# Patient Record
Sex: Female | Born: 1985 | Hispanic: Yes | Marital: Single | Smoking: Never smoker
Health system: Southern US, Community
[De-identification: ages and names within clinical notes are randomized; demographics above are authoritative.]

## PROBLEM LIST (undated history)

## (undated) ENCOUNTER — Inpatient Hospital Stay: Payer: Self-pay

## (undated) DIAGNOSIS — Z789 Other specified health status: Secondary | ICD-10-CM

## (undated) DIAGNOSIS — N760 Acute vaginitis: Secondary | ICD-10-CM

## (undated) DIAGNOSIS — B9689 Other specified bacterial agents as the cause of diseases classified elsewhere: Secondary | ICD-10-CM

## (undated) HISTORY — DX: Acute vaginitis: B96.89

## (undated) HISTORY — DX: Other specified bacterial agents as the cause of diseases classified elsewhere: N76.0

---

## 2005-03-03 ENCOUNTER — Emergency Department: Payer: Self-pay | Admitting: Emergency Medicine

## 2008-06-19 ENCOUNTER — Observation Stay: Payer: Self-pay

## 2008-06-25 ENCOUNTER — Inpatient Hospital Stay: Payer: Self-pay

## 2012-02-18 ENCOUNTER — Ambulatory Visit: Payer: Self-pay | Admitting: Sports Medicine

## 2013-06-25 ENCOUNTER — Emergency Department: Payer: Self-pay

## 2017-05-21 ENCOUNTER — Other Ambulatory Visit: Payer: Self-pay | Admitting: Family Medicine

## 2017-05-21 DIAGNOSIS — Z3492 Encounter for supervision of normal pregnancy, unspecified, second trimester: Secondary | ICD-10-CM

## 2017-06-11 ENCOUNTER — Ambulatory Visit
Admission: RE | Admit: 2017-06-11 | Discharge: 2017-06-11 | Disposition: A | Discharge status: Home / Self Care | Payer: Medicaid Other | Source: Ambulatory Visit | Attending: Family Medicine | Admitting: Family Medicine

## 2017-06-11 DIAGNOSIS — Z3482 Encounter for supervision of other normal pregnancy, second trimester: Secondary | ICD-10-CM | POA: Diagnosis not present

## 2017-06-11 DIAGNOSIS — Z3492 Encounter for supervision of normal pregnancy, unspecified, second trimester: Secondary | ICD-10-CM

## 2017-06-11 DIAGNOSIS — Z3A19 19 weeks gestation of pregnancy: Secondary | ICD-10-CM | POA: Insufficient documentation

## 2017-06-11 DIAGNOSIS — Z3480 Encounter for supervision of other normal pregnancy, unspecified trimester: Secondary | ICD-10-CM | POA: Diagnosis present

## 2017-07-09 ENCOUNTER — Inpatient Hospital Stay: Payer: Medicaid Other

## 2017-07-09 ENCOUNTER — Inpatient Hospital Stay
Admission: EM | Admit: 2017-07-09 | Discharge: 2017-07-09 | Disposition: A | Discharge status: Home / Self Care | Payer: Medicaid Other | Attending: Obstetrics and Gynecology | Admitting: Obstetrics and Gynecology

## 2017-07-09 DIAGNOSIS — Z79899 Other long term (current) drug therapy: Secondary | ICD-10-CM | POA: Diagnosis not present

## 2017-07-09 DIAGNOSIS — Z3A23 23 weeks gestation of pregnancy: Secondary | ICD-10-CM | POA: Diagnosis not present

## 2017-07-09 DIAGNOSIS — O4702 False labor before 37 completed weeks of gestation, second trimester: Secondary | ICD-10-CM

## 2017-07-09 HISTORY — DX: Other specified health status: Z78.9

## 2017-07-09 LAB — URINALYSIS, ROUTINE W REFLEX MICROSCOPIC
BILIRUBIN URINE: NEGATIVE
Glucose, UA: 50 mg/dL — AB
Hgb urine dipstick: NEGATIVE
KETONES UR: 5 mg/dL — AB
LEUKOCYTES UA: NEGATIVE
NITRITE: NEGATIVE
PH: 5 (ref 5.0–8.0)
Protein, ur: NEGATIVE mg/dL
Specific Gravity, Urine: 1.02 (ref 1.005–1.030)

## 2017-07-09 LAB — URINE DRUG SCREEN, QUALITATIVE (ARMC ONLY)
AMPHETAMINES, UR SCREEN: NOT DETECTED
BARBITURATES, UR SCREEN: NOT DETECTED
BENZODIAZEPINE, UR SCRN: NOT DETECTED
Cannabinoid 50 Ng, Ur ~~LOC~~: NOT DETECTED
Cocaine Metabolite,Ur ~~LOC~~: NOT DETECTED
MDMA (Ecstasy)Ur Screen: NOT DETECTED
METHADONE SCREEN, URINE: NOT DETECTED
OPIATE, UR SCREEN: NOT DETECTED
Phencyclidine (PCP) Ur S: NOT DETECTED
Tricyclic, Ur Screen: NOT DETECTED

## 2017-07-09 LAB — WET PREP, GENITAL
Sperm: NONE SEEN
TRICH WET PREP: NONE SEEN
YEAST WET PREP: NONE SEEN

## 2017-07-09 LAB — CHLAMYDIA/NGC RT PCR (ARMC ONLY)
CHLAMYDIA TR: NOT DETECTED
N gonorrhoeae: NOT DETECTED

## 2017-07-09 MED ORDER — LACTATED RINGERS IV SOLN: 500.0000 mL/h | Freq: Once | INTRAVENOUS | Status: DC

## 2017-07-09 MED ORDER — METRONIDAZOLE 0.75 % VA GEL: 1.0000 | Freq: Every day | VAGINAL | 0 refills | Status: DC

## 2017-07-09 MED ORDER — CALCIUM CARBONATE ANTACID 500 MG PO CHEW: 2.0000 | CHEWABLE_TABLET | ORAL | Status: DC | PRN

## 2017-07-09 MED ORDER — ACETAMINOPHEN 325 MG PO TABS: 650.0000 mg | ORAL_TABLET | ORAL | Status: DC | PRN

## 2017-07-09 MED ORDER — ZOLPIDEM TARTRATE 5 MG PO TABS: 5.0000 mg | ORAL_TABLET | Freq: Every evening | ORAL | Status: DC | PRN

## 2017-07-09 MED ORDER — METRONIDAZOLE 0.75 % VA GEL
1.0000 | Freq: Every day | VAGINAL | Status: DC
Start: 2017-07-09 — End: 2017-07-09
  Filled 2017-07-09: qty 70

## 2017-07-09 MED ORDER — PRENATAL MULTIVITAMIN CH: 1.0000 | ORAL_TABLET | Freq: Every day | ORAL | Status: DC

## 2017-07-09 MED ORDER — DOCUSATE SODIUM 100 MG PO CAPS: 100.0000 mg | ORAL_CAPSULE | Freq: Every day | ORAL | Status: DC

## 2017-07-09 NOTE — Discharge Instructions (Signed)
Vaginosis bacteriana (Bacterial Vaginosis) La vaginosis bacteriana es una infeccin de la vagina. Se produce cuando crece una cantidad excesiva de grmenes normales (bacterias sanas) en la vagina. Esta infeccin aumenta el riesgo de contraer otras infecciones de transmisin sexual. El tratamiento de esta infeccin puede ayudar a reducir el riesgo de otras infecciones, como:  Clamidia.  Gonorrea.  VIH.  Herpes. CUIDADOS EN EL HOGAR  Tome los medicamentos tal como se lo indic su mdico.  Finalice la prescripcin completa, aunque comience a sentirse mejor.  Comunique a sus compaeros sexuales que sufre una infeccin. Deben consultar a su mdico para iniciar un tratamiento.  Durante el tratamiento: ? Evite mantener relaciones sexuales o use preservativos de la forma correcta. ? No se haga duchas vaginales. ? No consuma alcohol a menos que el mdico lo autorice. ? No amamante a menos que el mdico la autorice.  SOLICITE AYUDA SI:  No mejora luego de 3 das de tratamiento.  Observa una secrecin (prdida) de color gris ms abundante que proviene de la vagina.  Siente ms dolor que antes.  Tiene fiebre.  ASEGRESE DE QUE:  Comprende estas instrucciones.  Controlar su afeccin.  Recibir ayuda de inmediato si no mejora o si empeora.  Esta informacin no tiene como fin reemplazar el consejo del mdico. Asegrese de hacerle al mdico cualquier pregunta que tenga. Document Released: 04/25/2008 Document Revised: 05/21/2015 Document Reviewed: 09/08/2012 Elsevier Interactive Patient Education  2017 Elsevier Inc.  

## 2017-07-09 NOTE — Final Progress Note (Signed)
TRIAGE NOTE to rule out Preterm Labor   History of Present Illness: Kelly Oneill is a 32 y.o. [id number] at [redacted]w[redacted]d presenting to triage for preterm contractions and pelvic pressure. Hx of preterm birth x2 and states she does get a shot at her prenatal provider to prevent preterm birth.  S/p intercourse on Sunday. +FM, no leaking fluid, no vaginal bleeding. FHT 155 by doppler.   There are no active problems to display for this patient.   Past Medical History:  Diagnosis Date  . Medical history non-contributory     History reviewed. No pertinent surgical history.  OB History  Gravida Para Term Preterm AB Living  SAB TAB Ectopic Multiple Live Births          2    # Outcome Date GA Lbr Len/2nd Weight Sex Delivery Anes PTL Lv  3 Current           2 Preterm 06/25/08   1.871 kg (4 lb 2 oz) F Vag-Spont None Y LIV  1 Preterm 10/11/01   1.417 kg (3 lb 2 oz) M Vag-Spont None Y LIV    Social History   Socioeconomic History  . Marital status: Single    Spouse name: Not on file  . Number of children: Not on file  . Years of education: Not on file  . Highest education level: Not on file  Occupational History  . Not on file  Social Needs  . Financial resource strain: Not on file  . Food insecurity:    Worry: Not on file    Inability: Not on file  . Transportation needs:    Medical: Not on file    Non-medical: Not on file  Tobacco Use  . Smoking status: Never Smoker  . Smokeless tobacco: Never Used  Substance and Sexual Activity  . Alcohol use: Never    Frequency: Never  . Drug use: Never  . Sexual activity: Yes    Birth control/protection: Surgical    Comment: BTL  Lifestyle  . Physical activity:    Days per week: Not on file    Minutes per session: Not on file  . Stress: Not on file  Relationships  . Social connections:    Talks on phone: Not on file    Gets together: Not on file    Attends religious service: Not on file    Active member of club  or organization: Not on file    Attends meetings of clubs or organizations: Not on file    Relationship status: Not on file  Other Topics Concern  . Not on file  Social History Narrative  . Not on file    History reviewed. No pertinent family history.  No Known Allergies  Medications Prior to Admission  Medication Sig Dispense Refill Last Dose  . albuterol (PROVENTIL) 2 MG tablet Take 2 mg by mouth 3 (three) times daily.   07/08/2017 at Unknown time  . Prenatal Vit-Fe Fumarate-FA (PRENATAL MULTIVITAMIN) TABS tablet Take 1 tablet by mouth daily at 12 noon.   07/08/2017 at Unknown time    Review of Systems - See HPI for OB specific ROS.   Vitals:  BP 119/68 (BP Location: Right Arm)   Pulse 72   Temp 98.8 F (37.1 C) (Oral)   Resp 16   Ht  (1.499 m)   Wt 73 kg (161 lb)   LMP 01/27/2017   BMI 32.52 kg/m  Physical  Examination: CONSTITUTIONAL: Well-developed, well-nourished female in no acute distress.  HENT:  Normocephalic, atraumatic EYES: Conjunctivae and EOM are normal. No scleral icterus.  NECK: Normal range of motion, supple, SKIN: Skin is warm and dry. No rash noted. Not diaphoretic. No erythema. No pallor. NEUROLGIC: Alert and oriented to person, place, and time. No gross cranial nerve deficit noted. PSYCHIATRIC: Normal mood and affect. Normal behavior. Normal judgment and thought content. CARDIOVASCULAR: Normal heart rate noted, regular rhythm RESPIRATORY: Effort and breath sounds normal, no problems with respiration noted ABDOMEN: Soft, nontender, nondistended, gravid.  Cervix: closed/thick/high Membranes:intact Fetal Monitoring:Baseline: 155 bpm Tocometer: Flat  Labs:  Results for orders placed or performed during the hospital encounter of 07/09/17 (from the past 24 hour(s))  Wet prep, genital   Collection Time: 07/09/17  7:16 PM  Result Value Ref Range   Yeast Wet Prep HPF POC NONE SEEN NONE SEEN   Trich, Wet Prep NONE SEEN NONE SEEN   Clue Cells Wet  Prep HPF POC PRESENT (A) NONE SEEN   WBC, Wet Prep HPF POC FEW (A) NONE SEEN   Sperm NONE SEEN   Urinalysis, Routine w reflex microscopic   Collection Time: 07/09/17  7:16 PM  Result Value Ref Range   Color, Urine YELLOW (A) YELLOW   APPearance CLEAR (A) CLEAR   Specific Gravity, Urine 1.020 1.005 - 1.030   pH 5.0 5.0 - 8.0   Glucose, UA 50 (A) NEGATIVE mg/dL   Hgb urine dipstick NEGATIVE NEGATIVE   Bilirubin Urine NEGATIVE NEGATIVE   Ketones, ur 5 (A) NEGATIVE mg/dL   Protein, ur NEGATIVE NEGATIVE mg/dL   Nitrite NEGATIVE NEGATIVE   Leukocytes, UA NEGATIVE NEGATIVE    Imaging Studies: US Ob Comp + 14 Wk  Result Date: 06/11/2017 CLINICAL DATA:  Gestational age by LMP of 19 weeks 5 days. Evaluate dating and anatomy. EXAM: OBSTETRICAL ULTRASOUND >14 WKS FINDINGS: Number of Fetuses: 1 Heart Rate:  168 bpm Movement: Yes Presentation: Cephalic Previa: No Placental Location: Fundal Amniotic Fluid (Subjective): Within normal limits Amniotic Fluid (Objective): Vertical pocket 5.3cm FETAL BIOMETRY BPD:  4.5cm 19w 4d HC:    16.7cm 19w 3d AC:   14.5cm 19w 6d FL:   3.0cm 19w 1d Current Mean GA: 19w 2d [place] EDC: 11/03/2017 FETAL ANATOMY Lateral Ventricles: Appears normal Thalami/CSP: Appears normal Posterior Fossa:  Appears normal Nuchal Region: Appears normal    NFT= 4.30mm Upper Lip: Appears normal Spine: Appears normal 4 Chamber Heart on Left: Appears normal LVOT: Appears normal RVOT: Appears normal Stomach on Left: Appears normal 3 Vessel Cord: Appears normal Cord Insertion site: Appears normal Kidneys: Appears normal Bladder: Appears normal Extremities: Appears normal Sex: Female Maternal Findings: Cervix:  4.1 cm TA IMPRESSION: Single living intrauterine fetus with mean gestational age of [redacted] weeks 2 days, and [place] EDC of 11/03/2017. This is concordant with LMP. No fetal anomalies seen involving visualized anatomy. Electronically Signed   By: Myles Rosenthal M.D.   On: 06/11/2017 14:58   US Ob Limited  Result  Date: 07/09/2017 CLINICAL DATA:  History of pre term deliveries. Approximately 23 weeks by LMP. Cramping. Symptoms for 4 days. EXAM: LIMITED OBSTETRIC ULTRASOUND FINDINGS: Number of Fetuses: 1 Heart Rate:  149 bpm Movement: Present Presentation: Transverse, head to maternal RIGHT. Placental Location: Fundal Previa: None Amniotic Fluid (Subjective):  Within normal limits. BPD: 5.7 cm 23 w  3 d MATERNAL FINDINGS: Cervix: Appears closed. 4.1 centimeters on transabdominal evaluation. Uterus/Adnexae: No adnexal mass. IMPRESSION: 1. Single living intrauterine fetus in  transverse presentation, head to maternal RIGHT. 2. Limited biometry and dates correlate well. 3. Cervix is long and closed. 4. Normal amniotic fluid volume. This exam is performed on an emergent basis and does not comprehensively evaluate fetal size, dating, or anatomy; follow-up coKoreamplete OB US should be considered if further fetal assessment is warranted. Electronically Signed   By: Norva Pavlov M.D.   On: 07/09/2017 20:15     Assessment and Plan: There are no active problems to display for this patient.  31yo [id number] at 23+2wks with hx of preterm delivery x2, presenting after intercourse 4 days ago with pelvic pressure and cramping.  - Ultrasound reassuring with normal fluid and a long 4cm cervix - cervix closed on exam - U/A negative for infection - wet prep positive for BV. D/c home with metrogel - f/u as scheduled  Cline Cools, MD, MPH

## 2017-07-09 NOTE — OB Triage Note (Addendum)
Pt is a G3P2 that presents from ED c/o abdominal cramping and pressure in her vagina. The pain started Monday and has not gotten any better. [facility] told pt to come in to be checked. Last intercourse was Sunday and pt denies VB, LOF and states positive FM. FHT obtained by doppler were 155 and monitoring for ctx. Pt had two previous preterm births one at 6 months and the second at 8 months gestation. Pt states the pressure in her vagina is about 8/10 scale and standing makes it better.

## 2017-07-11 LAB — URINE CULTURE: CULTURE: NO GROWTH

## 2017-08-23 ENCOUNTER — Inpatient Hospital Stay
Admission: EM | Admit: 2017-08-23 | Discharge: 2017-08-23 | DRG: 833 | Disposition: A | Discharge status: Other Acute Inpatient Hospital | Payer: Medicaid Other | Attending: Obstetrics and Gynecology | Admitting: Obstetrics and Gynecology

## 2017-08-23 ENCOUNTER — Other Ambulatory Visit: Payer: Self-pay

## 2017-08-23 DIAGNOSIS — Z8759 Personal history of other complications of pregnancy, childbirth and the puerperium: Secondary | ICD-10-CM

## 2017-08-23 DIAGNOSIS — Z3A29 29 weeks gestation of pregnancy: Secondary | ICD-10-CM

## 2017-08-23 DIAGNOSIS — O4292 Full-term premature rupture of membranes, unspecified as to length of time between rupture and onset of labor: Principal | ICD-10-CM | POA: Diagnosis present

## 2017-08-23 LAB — TYPE AND SCREEN
ABO/RH(D): O POS
ANTIBODY SCREEN: NEGATIVE

## 2017-08-23 LAB — CHLAMYDIA/NGC RT PCR (ARMC ONLY)
CHLAMYDIA TR: NOT DETECTED
N gonorrhoeae: NOT DETECTED

## 2017-08-23 LAB — ROM PLUS (ARMC ONLY): Rom Plus: POSITIVE

## 2017-08-23 MED ORDER — OXYTOCIN BOLUS FROM INFUSION: 500.0000 mL | Freq: Once | INTRAVENOUS | Status: DC

## 2017-08-23 MED ORDER — ONDANSETRON HCL 4 MG/2ML IJ SOLN: 4.0000 mg | Freq: Four times a day (QID) | INTRAMUSCULAR | Status: DC | PRN

## 2017-08-23 MED ORDER — ACETAMINOPHEN 325 MG PO TABS: 650.0000 mg | ORAL_TABLET | ORAL | Status: DC | PRN

## 2017-08-23 MED ORDER — LACTATED RINGERS IV SOLN
INTRAVENOUS | Status: DC
  Administered 2017-08-23: 17:00:00 via INTRAVENOUS

## 2017-08-23 MED ORDER — MAGNESIUM SULFATE 4 GM/100ML IV SOLN
4.0000 g | Freq: Once | INTRAVENOUS | Status: AC
  Administered 2017-08-23: 4 g via INTRAVENOUS
  Filled 2017-08-23: qty 100

## 2017-08-23 MED ORDER — MAGNESIUM SULFATE 40 G IN LACTATED RINGERS - SIMPLE
1.0000 g/h | INTRAVENOUS | Status: DC
  Filled 2017-08-23: qty 500

## 2017-08-23 MED ORDER — SODIUM CHLORIDE 0.9 % IV SOLN
500.0000 mg | Freq: Once | INTRAVENOUS | Status: AC
  Administered 2017-08-23: 500 mg via INTRAVENOUS
  Filled 2017-08-23: qty 500

## 2017-08-23 MED ORDER — LACTATED RINGERS IV SOLN: 500.0000 mL | INTRAVENOUS | Status: DC | PRN

## 2017-08-23 MED ORDER — OXYTOCIN 40 UNITS IN LACTATED RINGERS INFUSION - SIMPLE MED: 2.5000 [IU]/h | INTRAVENOUS | Status: DC

## 2017-08-23 MED ORDER — LIDOCAINE HCL (PF) 1 % IJ SOLN: 30.0000 mL | INTRAMUSCULAR | Status: DC | PRN

## 2017-08-23 MED ORDER — BETAMETHASONE SOD PHOS & ACET 6 (3-3) MG/ML IJ SUSP
12.0000 mg | INTRAMUSCULAR | Status: DC
  Administered 2017-08-23: 12 mg via INTRAMUSCULAR

## 2017-08-23 MED ORDER — BUTORPHANOL TARTRATE 2 MG/ML IJ SOLN: 1.0000 mg | INTRAMUSCULAR | Status: DC | PRN

## 2017-08-23 MED ORDER — BETAMETHASONE SOD PHOS & ACET 6 (3-3) MG/ML IJ SUSP
INTRAMUSCULAR | Status: AC
  Administered 2017-08-23: 12 mg via INTRAMUSCULAR
  Filled 2017-08-23: qty 5

## 2017-08-23 MED ORDER — SOD CITRATE-CITRIC ACID 500-334 MG/5ML PO SOLN: 30.0000 mL | ORAL | Status: DC | PRN

## 2017-08-23 MED ORDER — SODIUM CHLORIDE 0.9 % IV SOLN
2.0000 g | Freq: Four times a day (QID) | INTRAVENOUS | Status: DC
  Administered 2017-08-23: 2 g via INTRAVENOUS
  Filled 2017-08-23: qty 2000

## 2017-08-23 NOTE — Progress Notes (Signed)
Patient ID: Kelly Oneill, female   DOB: August 26, 1985, 32 y.o.   MRN: [id number] [facility] is here and [facility] Pt signed the EMTALA form. Pt is stable for transport with PPROM./ No S/S of labor. Pt has had MagSO4 4 gms IV infusing, Azithromycin 500 mg IV, Ampicillin 2gms and BMZ 1st dose given upon [facility]. _____________________________ Myrtie Cruisearon W. Jones,RN, MSN, CNM, FNP Certified Nurse Midwife [facility]

## 2017-08-23 NOTE — OB Triage Note (Signed)
Signed      Patient here for possible ROM, she states she has been leaking fluid since this morning around 5 am. Has had to change panty liners twice since then. She has no other complaints denies bleeding. She states that she has had 2 pre term deliveries.

## 2017-08-23 NOTE — H&P (Addendum)
Kelly Oneill is a 32 y.o. female Gpresenting for LOF since 59am today states it is clear and it comes out q 2-3 hours and then stops. PNC at [facility] significant for 2 prior PTD's (one at 27 weeks and one at 28weeks and has been taking the weekly 17hp shot). Pt was not sure if she ruptured and did not want to come in till she was sure it was her water breaking. LMP of 01/24/17 with EDD of 10/31/17 by dating. Only [place] found from 06/11/17 where pt was 19 5/7 weeks and [facility] assigned pt to be based on her [place] which puts pt at 29 5/7 weeks today.  OB History    Gravida  3   Para  2   Term      Preterm  2   AB      Living  2     SAB      TAB      Ectopic      Multiple      Live Births  2          Past Medical History:  Diagnosis Date  . Medical history non-contributory    History reviewed. No pertinent surgical history. Family History: family history is not on file. Social History:  reports that she has never smoked. She has never used smokeless tobacco. She reports that she does not drink alcohol or use drugs.    Maternal Diabetes: 116 Genetic Screening:  Maternal Ultrasounds/Referrals:  Fetal Ultrasounds or other Referrals:   Maternal Substance Abuse:   Significant Maternal Medications: Significant Maternal Lab Results:  Other Comments:   Review of Systems  Constitutional: Negative.   HENT: Negative.   Eyes: Negative.   Respiratory: Negative.   Cardiovascular: Negative.   Gastrointestinal: Negative.   Genitourinary: Negative.   Musculoskeletal: Negative.   Skin: Negative.   Neurological: Negative.   Endo/Heme/Allergies: Negative.   Psychiatric/Behavioral: Negative.   OB:+LOF clear since 0500am History   Last menstrual period 01/27/2017. Exam Physical Exam  Gen:A,A&Ox3 HEENT: Normocephalic, Eyes non-icteric. HEART:S1S2, RRR, No M/R/G LUNGS:CTA bilat, no W/R/R ABD: Gravid, FHR: 150.  Extrems:warm, dry, NT, Neg Homan's GYN: no fluid noted on thighs,  opening the labia there is mucoid dc noted. Spec exam with sterile speculum, no fluid noted with cough. Cx is pinpoint with slight mucoid at the os. Ferning slide taken and Wet prep done. Ferning was pos under microscope, Nitrazine is expired and no other Nitrazine in hospital. On backorder and cannot obtain Nitrazine. ROM Plus sent to lab. No liquid pouring out of cx or on pad or thigs during exam. Prenatal labs: ABO, Rh:  O pos Antibody:  Neg  Rubella:  Immune RPR:   NR HBsAg:   Neg HIV:   NR GBS:   Unknown GC/Ch from 07/09/17: neg/Neg Varicella immune Assessment/Plan: A:IUP at 29 5/7 weeks 2. Th PPROM 3. At risk for PTD due to previous hx on chart of 32 and 36 weeks here at [facility] Pt has been on 17 hp at [facility].   1645pm:Dr Schermerhorn given report and agrees with transfer to [facility]. Paged at 1645 and again at 1650 and called L&D at 1655 and still have not received a call back from [facility] 4 gm IV bolus started and Ampicillin 2 gms IV q 6 hours started, pt received 1st dose of Celestone upon arrival. ROM plus was pos and disc with NICU and due to EDD putting pt at 29 5/7 weeks advised to ship  to [facility]. [facility] agreed [facility] Called [facility] for transportation and no trucks available. Called [facility] ______________________________ Sharee Pimplearon W Kathlynn Swofford 08/23/2017, 4:12 PM

## 2017-08-23 NOTE — OB Triage Note (Deleted)
Patient here for possible ROM, she states she has been leaking fluid since this morning around 5 am. Has had to change panty liners twice since then. She has no other complaints deniers bleeding. She states that she has had 23 pre term deliveries.

## 2017-08-23 NOTE — Progress Notes (Signed)
[facility] at bedside to transport patient.  Report given to team, EMTALA signed and copies of records given to transport.

## 2018-01-26 ENCOUNTER — Encounter (HOSPITAL_COMMUNITY): Payer: Self-pay

## 2021-01-29 DIAGNOSIS — J4520 Mild intermittent asthma, uncomplicated: Secondary | ICD-10-CM | POA: Diagnosis not present

## 2021-01-29 DIAGNOSIS — Z0000 Encounter for general adult medical examination without abnormal findings: Secondary | ICD-10-CM | POA: Diagnosis not present

## 2021-01-29 DIAGNOSIS — Z20822 Contact with and (suspected) exposure to covid-19: Secondary | ICD-10-CM | POA: Diagnosis not present

## 2021-01-29 DIAGNOSIS — E669 Obesity, unspecified: Secondary | ICD-10-CM | POA: Diagnosis not present

## 2021-01-30 ENCOUNTER — Ambulatory Visit
Admission: RE | Admit: 2021-01-30 | Discharge: 2021-01-30 | Disposition: A | Discharge status: Home / Self Care | Payer: Medicaid Other | Source: Ambulatory Visit | Attending: Family Medicine | Admitting: Family Medicine

## 2021-01-30 ENCOUNTER — Other Ambulatory Visit: Payer: Self-pay

## 2021-01-30 ENCOUNTER — Other Ambulatory Visit: Payer: Self-pay | Admitting: Family Medicine

## 2021-01-30 DIAGNOSIS — J45909 Unspecified asthma, uncomplicated: Secondary | ICD-10-CM | POA: Diagnosis not present

## 2021-01-30 DIAGNOSIS — Z01818 Encounter for other preprocedural examination: Secondary | ICD-10-CM

## 2021-02-12 DIAGNOSIS — E669 Obesity, unspecified: Secondary | ICD-10-CM | POA: Diagnosis not present

## 2021-02-15 HISTORY — PX: OTHER SURGICAL HISTORY: SHX169

## 2021-07-01 DIAGNOSIS — J4520 Mild intermittent asthma, uncomplicated: Secondary | ICD-10-CM | POA: Diagnosis not present

## 2021-07-01 DIAGNOSIS — J309 Allergic rhinitis, unspecified: Secondary | ICD-10-CM | POA: Diagnosis not present

## 2021-07-01 DIAGNOSIS — Z1389 Encounter for screening for other disorder: Secondary | ICD-10-CM | POA: Diagnosis not present

## 2021-07-01 DIAGNOSIS — Z112 Encounter for screening for other bacterial diseases: Secondary | ICD-10-CM | POA: Diagnosis not present

## 2021-08-02 ENCOUNTER — Telehealth: Payer: Self-pay

## 2021-08-02 NOTE — Telephone Encounter (Signed)
Called and spoke with pt who stated she already has a PCP at [facility]- pt does not remember PCP name.

## 2021-08-29 DIAGNOSIS — N760 Acute vaginitis: Secondary | ICD-10-CM | POA: Diagnosis not present

## 2021-08-29 DIAGNOSIS — Z1389 Encounter for screening for other disorder: Secondary | ICD-10-CM | POA: Diagnosis not present

## 2021-08-30 DIAGNOSIS — Z889 Allergy status to unspecified drugs, medicaments and biological substances status: Secondary | ICD-10-CM | POA: Diagnosis not present

## 2021-08-30 DIAGNOSIS — Z1389 Encounter for screening for other disorder: Secondary | ICD-10-CM | POA: Diagnosis not present

## 2021-10-31 DIAGNOSIS — M62838 Other muscle spasm: Secondary | ICD-10-CM | POA: Diagnosis not present

## 2021-10-31 DIAGNOSIS — Z1389 Encounter for screening for other disorder: Secondary | ICD-10-CM | POA: Diagnosis not present

## 2021-12-30 DIAGNOSIS — Z1389 Encounter for screening for other disorder: Secondary | ICD-10-CM | POA: Diagnosis not present

## 2021-12-30 DIAGNOSIS — B3731 Acute candidiasis of vulva and vagina: Secondary | ICD-10-CM | POA: Diagnosis not present

## 2022-02-10 DIAGNOSIS — R87810 Cervical high risk human papillomavirus (HPV) DNA test positive: Secondary | ICD-10-CM

## 2022-02-10 HISTORY — DX: Cervical high risk human papillomavirus (HPV) DNA test positive: R87.810

## 2022-02-12 DIAGNOSIS — Z1389 Encounter for screening for other disorder: Secondary | ICD-10-CM | POA: Diagnosis not present

## 2022-02-12 DIAGNOSIS — N760 Acute vaginitis: Secondary | ICD-10-CM | POA: Diagnosis not present

## 2022-02-12 DIAGNOSIS — Z01419 Encounter for gynecological examination (general) (routine) without abnormal findings: Secondary | ICD-10-CM | POA: Diagnosis not present

## 2022-04-08 ENCOUNTER — Encounter: Payer: Medicaid Other | Admitting: Obstetrics

## 2022-04-21 ENCOUNTER — Encounter: Payer: Medicaid Other | Admitting: Licensed Practical Nurse

## 2022-04-29 ENCOUNTER — Encounter: Payer: Medicaid Other | Admitting: Advanced Practice Midwife

## 2022-04-30 DIAGNOSIS — H5213 Myopia, bilateral: Secondary | ICD-10-CM | POA: Diagnosis not present

## 2022-05-01 DIAGNOSIS — J4520 Mild intermittent asthma, uncomplicated: Secondary | ICD-10-CM | POA: Diagnosis not present

## 2022-05-25 NOTE — Progress Notes (Unsigned)
    [facility]   No chief complaint on file.   HPI:      Kelly Oneill is a 37 y.o. [id number] whose LMP was No LMP recorded., presents today for NP eval of   referred by PCP    Patient Active Problem List   Diagnosis Date Noted   History of preterm premature rupture of membranes (PPROM) 08/23/2017    No past surgical history on file.  No family history on file.  Social History   Socioeconomic History   Marital status: Single    Spouse name: Not on file   Number of children: Not on file   Years of education: Not on file   Highest education level: Not on file  Occupational History   Not on file  Tobacco Use   Smoking status: Never   Smokeless tobacco: Never  Substance and Sexual Activity   Alcohol use: Never   Drug use: Never   Sexual activity: Yes    Birth control/protection: Surgical    Comment: BTL  Other Topics Concern   Not on file  Social History Narrative   Not on file   Social Determinants of Health   Financial Resource Strain: Not on file  Food Insecurity: Not on file  Transportation Needs: Not on file  Physical Activity: Not on file  Stress: Not on file  Social Connections: Not on file  Intimate Partner Violence: Not on file    Outpatient Medications Prior to Visit  Medication Sig Dispense Refill   albuterol (PROVENTIL) 2 MG tablet Take 2 mg by mouth 3 (three) times daily.     hydroxyprogesterone caproate (MAKENA) 250 mg/mL OIL injection Inject 250 mg into the muscle once.     metroNIDAZOLE (METROGEL) 0.75 % vaginal gel Place 1 Applicatorful vaginally at bedtime. (Patient not taking: Reported on 08/23/2017) 70 g 0   Prenatal Vit-Fe Fumarate-FA (PRENATAL MULTIVITAMIN) TABS tablet Take 1 tablet by mouth daily at 12 noon.     No facility-administered medications prior to visit.      ROS:  Review of Systems BREAST: No symptoms   OBJECTIVE:   Vitals:  There were no vitals taken for this  visit.  Physical Exam  Results: No results found for this or any previous visit (from the past 24 hour(s)).   Assessment/Plan: No diagnosis found.    No orders of the defined types were placed in this encounter.     No follow-ups on file.  Veola Cafaro B. Corianne Buccellato, PA-C 05/25/2022 9:07 AM

## 2022-05-26 ENCOUNTER — Encounter: Payer: Self-pay | Admitting: Obstetrics and Gynecology

## 2022-05-26 ENCOUNTER — Ambulatory Visit (INDEPENDENT_AMBULATORY_CARE_PROVIDER_SITE_OTHER): Payer: Medicaid Other | Admitting: Obstetrics and Gynecology

## 2022-05-26 VITALS — BP 112/70 | Ht 59.0 in | Wt 167.0 lb

## 2022-05-26 DIAGNOSIS — B9689 Other specified bacterial agents as the cause of diseases classified elsewhere: Secondary | ICD-10-CM

## 2022-05-26 DIAGNOSIS — N760 Acute vaginitis: Secondary | ICD-10-CM | POA: Diagnosis not present

## 2022-05-26 DIAGNOSIS — Z124 Encounter for screening for malignant neoplasm of cervix: Secondary | ICD-10-CM

## 2022-05-26 DIAGNOSIS — Z1151 Encounter for screening for human papillomavirus (HPV): Secondary | ICD-10-CM

## 2022-05-26 NOTE — Patient Instructions (Addendum)
I value your feedback and you entrusting us with your care. If you get a [facility] patient survey, I would appreciate you taking the time to let us know about your experience today. Thank you!  HEALTHY VAGINAL HYGIENE  AVOID   Panytyhose Synthetic underwear (wear COTTON underwear)  Tight pants/jeans Thongs Pantyliners Scented soaps/shower gels (use Dove Sensitive Skin soap or water to clean) Bubble bath/bath bombs Scented detergents  ALL dryer sheets (line dry underwear if using them on your other clothing) Feminine sprays/douches   FOR RECURRENT BACTERIAL VAGINOSIS (BV) Above recommendations and ADD probiotics daily, USE CONDOMS  Visit [email or web address]    

## 2022-05-27 ENCOUNTER — Encounter: Payer: Self-pay | Admitting: Obstetrics and Gynecology

## 2022-05-30 LAB — NUSWAB VAGINITIS (VG)
Candida albicans, NAA: NEGATIVE
Candida glabrata, NAA: NEGATIVE
Trich vag by NAA: NEGATIVE

## 2022-05-30 NOTE — Progress Notes (Signed)
Pls let pt know culture neg for BV, etc. Cont probiotics. F/u prn

## 2022-10-06 DIAGNOSIS — R1031 Right lower quadrant pain: Secondary | ICD-10-CM | POA: Diagnosis not present

## 2022-10-06 DIAGNOSIS — R0602 Shortness of breath: Secondary | ICD-10-CM | POA: Diagnosis not present

## 2022-10-06 DIAGNOSIS — R1032 Left lower quadrant pain: Secondary | ICD-10-CM | POA: Diagnosis not present

## 2022-10-06 DIAGNOSIS — R110 Nausea: Secondary | ICD-10-CM | POA: Diagnosis not present

## 2022-10-07 DIAGNOSIS — R1032 Left lower quadrant pain: Secondary | ICD-10-CM | POA: Diagnosis not present

## 2022-10-07 DIAGNOSIS — R1031 Right lower quadrant pain: Secondary | ICD-10-CM | POA: Diagnosis not present

## 2022-10-08 ENCOUNTER — Telehealth: Payer: Self-pay

## 2022-10-08 NOTE — Telephone Encounter (Signed)
Pt called triage, wants to get some advise of what she can do? She said she went to [facility] ER due to some pelvic pain, had U/S done and per [place] she has a "ring of fire" in her left ovary. She has been sent home and she is still having pain, not excruciating but still having pain. Advised to take tylenol as needed. She saw ABC 05/2022 and was advised to reach out to [place] if she needed anything. ABC out of the office returns tomorrow but patient does not want to wait until we can hear back from [facility]. She would like to get some advise from our on call provider.

## 2022-10-08 NOTE — Telephone Encounter (Signed)
Pls let pt know I reviewed CT and u/s. Pt has small LT ovarian cyst (explains "ring of fire"). NSAIDs/heating pad in meantime. Will go away on its own, can take 8-10 wks but should start to improve; no further imaging needed. Thx

## 2022-10-08 NOTE — Telephone Encounter (Signed)
Spoke to our on call provider MMF, she's thinking it might be for of a GI issue. MMF offered to prescribe something a little stronger that tylenol, pt refused. Pt aware, says pain starts under left breast going down to left side of pelvic area. She is taking 400 mg of ibuprofen about once a day, doesn't like to take much medicine. Pain has been on/off for the past month. Advised heating pad on left side in meantime, pt wants ABC's input.

## 2022-10-09 NOTE — Telephone Encounter (Signed)
Spoke with Kelly Oneill. Explained LTO cyst could be cause of pain since 7/24 except she states pain worse now than then. Sx intermittent, has had diarrhea since she left hospital. Hasn't tried immodium. Recommended trying it to see if helps with LLQ pain. If so, the pain is related to diarrhea. If sx persist, f/u with PCP for further eval. Otherwise, LTO cyst will resolve on its own.

## 2022-10-09 NOTE — Telephone Encounter (Signed)
Pt aware. She is insisting that there has to be something wrong w her because this pain started exactly on 08/26/22. Been on/off since then and this last time it started Sunday and it has not gone away since Sunday. She is asking if ABC can give her a call.

## 2022-10-16 DIAGNOSIS — R109 Unspecified abdominal pain: Secondary | ICD-10-CM | POA: Diagnosis not present

## 2022-10-16 DIAGNOSIS — Z1389 Encounter for screening for other disorder: Secondary | ICD-10-CM | POA: Diagnosis not present

## 2023-01-19 DIAGNOSIS — N760 Acute vaginitis: Secondary | ICD-10-CM | POA: Diagnosis not present

## 2023-01-19 DIAGNOSIS — Z1389 Encounter for screening for other disorder: Secondary | ICD-10-CM | POA: Diagnosis not present

## 2023-02-13 ENCOUNTER — Other Ambulatory Visit: Payer: Self-pay

## 2023-02-13 DIAGNOSIS — R519 Headache, unspecified: Secondary | ICD-10-CM | POA: Insufficient documentation

## 2023-02-13 DIAGNOSIS — H7292 Unspecified perforation of tympanic membrane, left ear: Secondary | ICD-10-CM | POA: Diagnosis not present

## 2023-02-13 DIAGNOSIS — H938X2 Other specified disorders of left ear: Secondary | ICD-10-CM | POA: Diagnosis not present

## 2023-02-13 DIAGNOSIS — H539 Unspecified visual disturbance: Secondary | ICD-10-CM | POA: Diagnosis not present

## 2023-02-13 DIAGNOSIS — R42 Dizziness and giddiness: Secondary | ICD-10-CM | POA: Diagnosis not present

## 2023-02-13 DIAGNOSIS — R202 Paresthesia of skin: Secondary | ICD-10-CM | POA: Diagnosis not present

## 2023-02-13 DIAGNOSIS — S0990XA Unspecified injury of head, initial encounter: Secondary | ICD-10-CM | POA: Diagnosis not present

## 2023-02-13 LAB — BASIC METABOLIC PANEL
Anion gap: 10 (ref 5–15)
BUN: 18 mg/dL (ref 6–20)
CO2: 25 mmol/L (ref 22–32)
Calcium: 8.7 mg/dL — ABNORMAL LOW (ref 8.9–10.3)
Chloride: 102 mmol/L (ref 98–111)
Creatinine, Ser: 0.8 mg/dL (ref 0.44–1.00)
GFR, Estimated: >60 mL/min (ref >60)
Glucose, Bld: 116 mg/dL — ABNORMAL HIGH (ref 70–99)
Potassium: 3.6 mmol/L (ref 3.5–5.1)
Sodium: 137 mmol/L (ref 135–145)

## 2023-02-13 LAB — CBC
HCT: 39.9 % (ref 36.0–46.0)
Hemoglobin: 13.5 g/dL (ref 12.0–15.0)
MCH: 29.5 pg (ref 26.0–34.0)
MCHC: 33.8 g/dL (ref 30.0–36.0)
MCV: 87.1 fL (ref 80.0–100.0)
Platelets: 365 10*3/uL (ref 150–400)
RBC: 4.58 MIL/uL (ref 3.87–5.11)
RDW: 12.2 % (ref 11.5–15.5)
WBC: 8.8 10*3/uL (ref 4.0–10.5)
nRBC: 0 % (ref 0.0–0.2)

## 2023-02-13 NOTE — ED Triage Notes (Addendum)
 Pt sts that she has been having dizziness and nausea. Pt sts that the room is spinning. Pt advised that she has been taking motrin but it is not helping. Pt sts that she also has pain in her left ear.

## 2023-02-14 ENCOUNTER — Emergency Department: Payer: Medicaid Other

## 2023-02-14 ENCOUNTER — Emergency Department
Admission: EM | Admit: 2023-02-14 | Discharge: 2023-02-14 | Disposition: A | Discharge status: Home / Self Care | Payer: Medicaid Other | Attending: Emergency Medicine | Admitting: Emergency Medicine

## 2023-02-14 DIAGNOSIS — R42 Dizziness and giddiness: Secondary | ICD-10-CM

## 2023-02-14 DIAGNOSIS — H7292 Unspecified perforation of tympanic membrane, left ear: Secondary | ICD-10-CM

## 2023-02-14 DIAGNOSIS — S0990XA Unspecified injury of head, initial encounter: Secondary | ICD-10-CM | POA: Diagnosis not present

## 2023-02-14 MED ORDER — MECLIZINE HCL 25 MG PO TABS: 25.0000 mg | ORAL_TABLET | Freq: Three times a day (TID) | ORAL | 0 refills | Status: AC | PRN

## 2023-02-14 MED ORDER — MECLIZINE HCL 25 MG PO TABS
25.0000 mg | ORAL_TABLET | Freq: Once | ORAL | Status: AC
  Administered 2023-02-14: 25 mg via ORAL
  Filled 2023-02-14: qty 1

## 2023-02-14 NOTE — ED Provider Notes (Signed)
 [facility] Provider Note    Event Date/Time   First MD Initiated Contact with Patient 02/14/23 [phone]     (approximate)   History   Dizziness   HPI  Kelly Oneill is a 38 y.o. female who presents to the ED for evaluation of Dizziness   I reviewed urgent care visit from a few hours ago.  Seen for subacute dizzy spells.  Patient presents alongside her husband for evaluation of intermittent vertigo over the past 3 weeks.  Symptoms started as she was doing dead lifts in the gym 3 weeks ago and has had intermittent symptoms since then.  Denies any particular head trauma such as being hit in the head with a ball or any particular injuries.  Since then, intermittent vertigo, muffled hearing on the left ear, occasional headaches.  No syncope or additional trauma.  No fevers.  No drainage or discharge from the ear.  All symptoms on the left side.   Physical Exam   Triage Vital Signs: ED Triage Vitals [02/13/23 2200]  Encounter Vitals Group     BP (!) 137/92     Systolic BP Percentile      Diastolic BP Percentile      Pulse Rate 75     Resp 18     Temp 98 F (36.7 C)     Temp Source Oral     SpO2 97 %     Weight 162 lb (73.5 kg)     Height 4' 11 (1.499 m)     Head Circumference      Peak Flow      Pain Score 4     Pain Loc      Pain Education      Exclude from Growth Chart     Most recent vital signs: Vitals:   02/13/23 2200  BP: (!) 137/92  Pulse: 75  Resp: 18  Temp: 98 F (36.7 C)  SpO2: 97%    General: Awake, no distress.  Well-appearing and conversational.  No apparent hearing impairment. CV:  Good peripheral perfusion.  Resp:  Normal effort.  Abd:  No distention.  MSK:  No deformity noted.  Neuro:  No focal deficits appreciated. Other:  No signs of otitis externa.  No pain with ear manipulation.  No pain over mastoid.  TM is clear and around the 9 o'clock position I appreciate a small perforation of the TM.  No purulence,  discharge, fluid level   ED Results / Procedures / Treatments   Labs (all labs ordered are listed, but only abnormal results are displayed) Labs Reviewed  BASIC METABOLIC PANEL - Abnormal; Notable for the following components:      Result Value   Glucose, Bld 116 (*)    Calcium  8.7 (*)    All other components within normal limits  CBC    EKG Sinus rhythm with a rate of 77 bpm.  Normal axis and intervals.  No evidence of acute ischemia.  No comparison.  RADIOLOGY CT head interpreted by me without evidence of acute intracranial pathology  Official radiology report(s): No results found.  PROCEDURES and INTERVENTIONS:  Procedures  Medications - No data to display   IMPRESSION / MDM / ASSESSMENT AND PLAN / ED COURSE  I reviewed the triage vital signs and the nursing notes.  Differential diagnosis includes, but is not limited to, BPPV, stroke, AOM, TM perforation  {Patient presents with symptoms of an acute illness or injury that is potentially life-threatening.  Patient presents with subacute intermittent vertiginous dizziness, popping sensations and altered hearing of the left ear.  All symptoms started while she was performing a dead lift and she acknowledges that she does not always breathe is much as she should.  I do see a small TM rupture on exam and this could represent her changes in hearing.  I suspect a peripheral, BPPV, etiology of her vertigo.  CT reassuring.  No indication for MRI.  No other neurologic deficits.  Doubt stroke.  Discharged with prescription for meclizine  and referral to ENT  Clinical Course as of 02/14/23 0646  Sat Feb 14, 2023  0139 Left ear popping sensation 3 weeks ago, started during a deadlift. Intermittent since then [DS]    Clinical Course User Index [DS] Claudene Rover, MD     FINAL CLINICAL IMPRESSION(S) / ED DIAGNOSES   Final diagnoses:  None     Rx / DC Orders   ED Discharge Orders     None        Note:  This  document was prepared using Dragon voice recognition software and may include unintentional dictation errors.   Claudene Rover, MD 02/14/23 [phone]

## 2023-02-14 NOTE — Discharge Instructions (Addendum)
 Try the meclizine medication as needed 3 times per day for the next few days.  If you are still having symptoms by next week call the ENT doctor.

## 2023-02-26 DIAGNOSIS — N760 Acute vaginitis: Secondary | ICD-10-CM | POA: Diagnosis not present

## 2023-02-26 DIAGNOSIS — Z1389 Encounter for screening for other disorder: Secondary | ICD-10-CM | POA: Diagnosis not present

## 2023-02-26 DIAGNOSIS — H6990 Unspecified Eustachian tube disorder, unspecified ear: Secondary | ICD-10-CM | POA: Diagnosis not present

## 2023-04-16 DIAGNOSIS — Z1389 Encounter for screening for other disorder: Secondary | ICD-10-CM | POA: Diagnosis not present

## 2023-04-16 DIAGNOSIS — N760 Acute vaginitis: Secondary | ICD-10-CM | POA: Diagnosis not present

## 2023-05-19 DIAGNOSIS — Z1389 Encounter for screening for other disorder: Secondary | ICD-10-CM | POA: Diagnosis not present

## 2023-05-19 DIAGNOSIS — N760 Acute vaginitis: Secondary | ICD-10-CM | POA: Diagnosis not present

## 2023-06-09 ENCOUNTER — Encounter: Payer: Self-pay | Admitting: Certified Nurse Midwife

## 2023-07-09 ENCOUNTER — Encounter: Payer: Self-pay | Admitting: Certified Nurse Midwife

## 2023-07-09 ENCOUNTER — Ambulatory Visit (INDEPENDENT_AMBULATORY_CARE_PROVIDER_SITE_OTHER): Admitting: Certified Nurse Midwife

## 2023-07-09 VITALS — BP 113/84 | HR 80 | Resp 16 | Ht 59.0 in | Wt 163.0 lb

## 2023-07-09 DIAGNOSIS — N83202 Unspecified ovarian cyst, left side: Secondary | ICD-10-CM

## 2023-07-09 DIAGNOSIS — N904 Leukoplakia of vulva: Secondary | ICD-10-CM | POA: Diagnosis not present

## 2023-07-09 DIAGNOSIS — N83201 Unspecified ovarian cyst, right side: Secondary | ICD-10-CM

## 2023-07-09 MED ORDER — CLOBETASOL PROPIONATE 0.05 % EX OINT
1.0000 | TOPICAL_OINTMENT | Freq: Two times a day (BID) | CUTANEOUS | 0 refills | Status: AC
Start: 2023-07-09 — End: 2023-08-08

## 2023-07-09 NOTE — Progress Notes (Signed)
    GYNECOLOGY PROGRESS NOTE  Subjective:    Patient ID: Kelly Oneill, female    DOB: [birth year], 38 y.o.   MRN: [id number]  HPI  Patient is a 38 y.o. 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 She has been having recurrent BV and Yeast since 2017. Now her symptoms resemble lichen simplex chronicus. Her PCP Ardia Kraft, NP treated her symptoms with lotrisone and hydroxyzine. She reports that her symptoms are better as long as she uses the cream, but it wears off before its time to use it again. She has not been sleeping at night because of the itching.  The following portions of the patient's history were reviewed and updated as appropriate: allergies, current medications, past family history, past medical history, past social history, past surgical history, and problem list.  Review of Systems Pertinent items are noted in HPI.   Objective:   Blood pressure 113/84, pulse 80, resp. rate 16, height 4\' 11"  (1.499 m), weight 163 lb (73.9 kg). Body mass index is 32.92 kg/m. General appearance: alert, cooperative, and no distress Abdomen: soft, non-tender; bowel sounds normal; no masses,  no organomegaly Pelvic: bilateral slightly white skin on lower labia majora.     Assessment:   1. Lichen sclerosus of female genitalia      Plan:   Suspicious of lichen sclerosus due history of vulvar itching with negative wet prep testing and no relief from yeast or BV treatments. Physical exam with slightly thin snd white skin on labia majora. Will trial clobetasol cream twice daily for two weeks and assess for improvement. Explained that if this improves symptoms, it is likely lichen sclerosus and we will continue with the cream as a treatment. With no improvement, recommend biopsy of area.  Patient requesting US  to follow up on cyst from 6 months ago. She had pain that resolved but is now back.     Donato Fu, CNM [facility]

## 2023-08-06 ENCOUNTER — Ambulatory Visit

## 2023-08-06 DIAGNOSIS — R102 Pelvic and perineal pain: Secondary | ICD-10-CM | POA: Diagnosis not present

## 2023-08-06 DIAGNOSIS — N83202 Unspecified ovarian cyst, left side: Secondary | ICD-10-CM

## 2023-08-24 ENCOUNTER — Telehealth: Payer: Self-pay

## 2023-08-24 NOTE — Telephone Encounter (Signed)
 Please call and schedule patient for follow up on U/S results with [facility].

## 2023-08-24 NOTE — Telephone Encounter (Signed)
 Contacted the patient via phone. She is confirmed for 8/4. I offered the patient 7/29 with Dam[facility], the patient declined due to going out of town.

## 2023-09-11 IMAGING — CR DG CHEST 2V
1 series · 2 of 2 positions shown · non-contrast
Comparison: None.

CLINICAL DATA: Pre-op clearance exam.  Asthma.

EXAM:
CHEST - 2 VIEW

[Series 1: dg chest 2 view · 0.14mm/px · 2 of 2 slices shown]
[im 1/2]
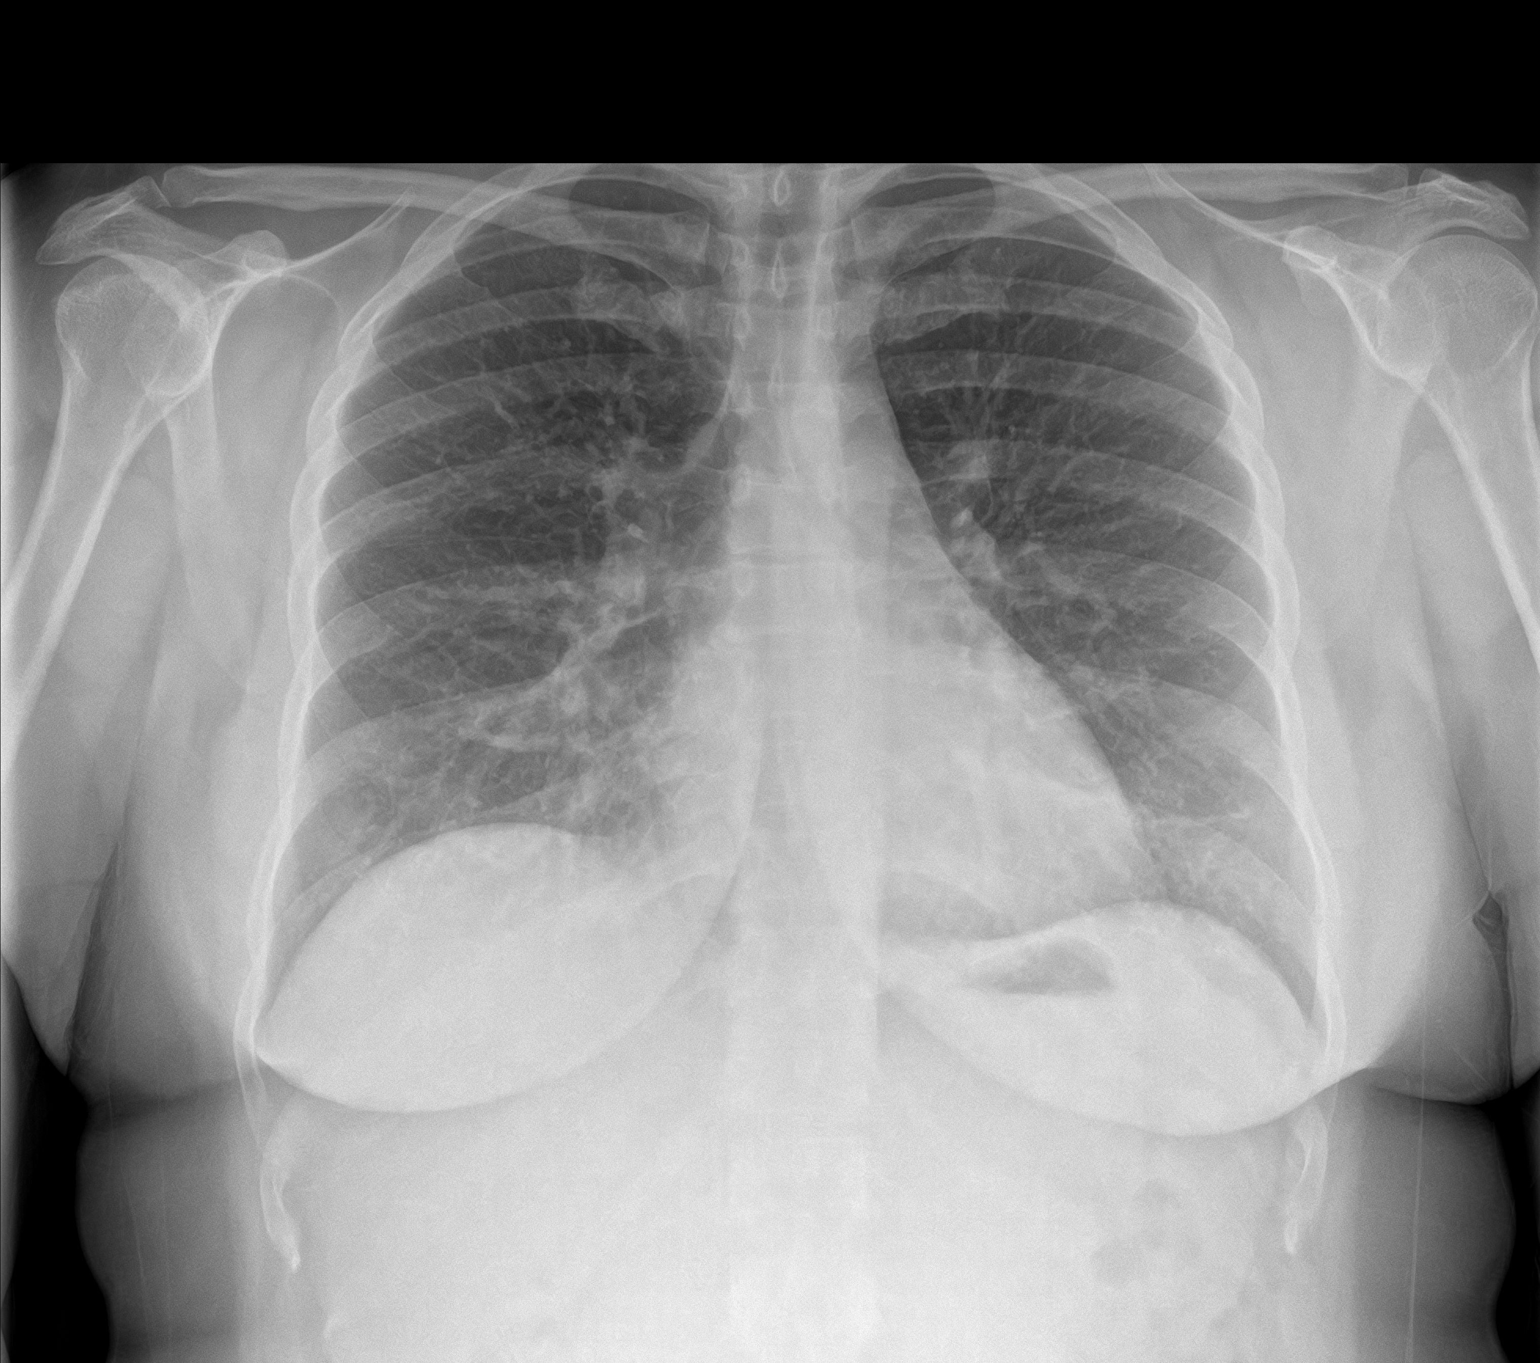
[im 2/2]
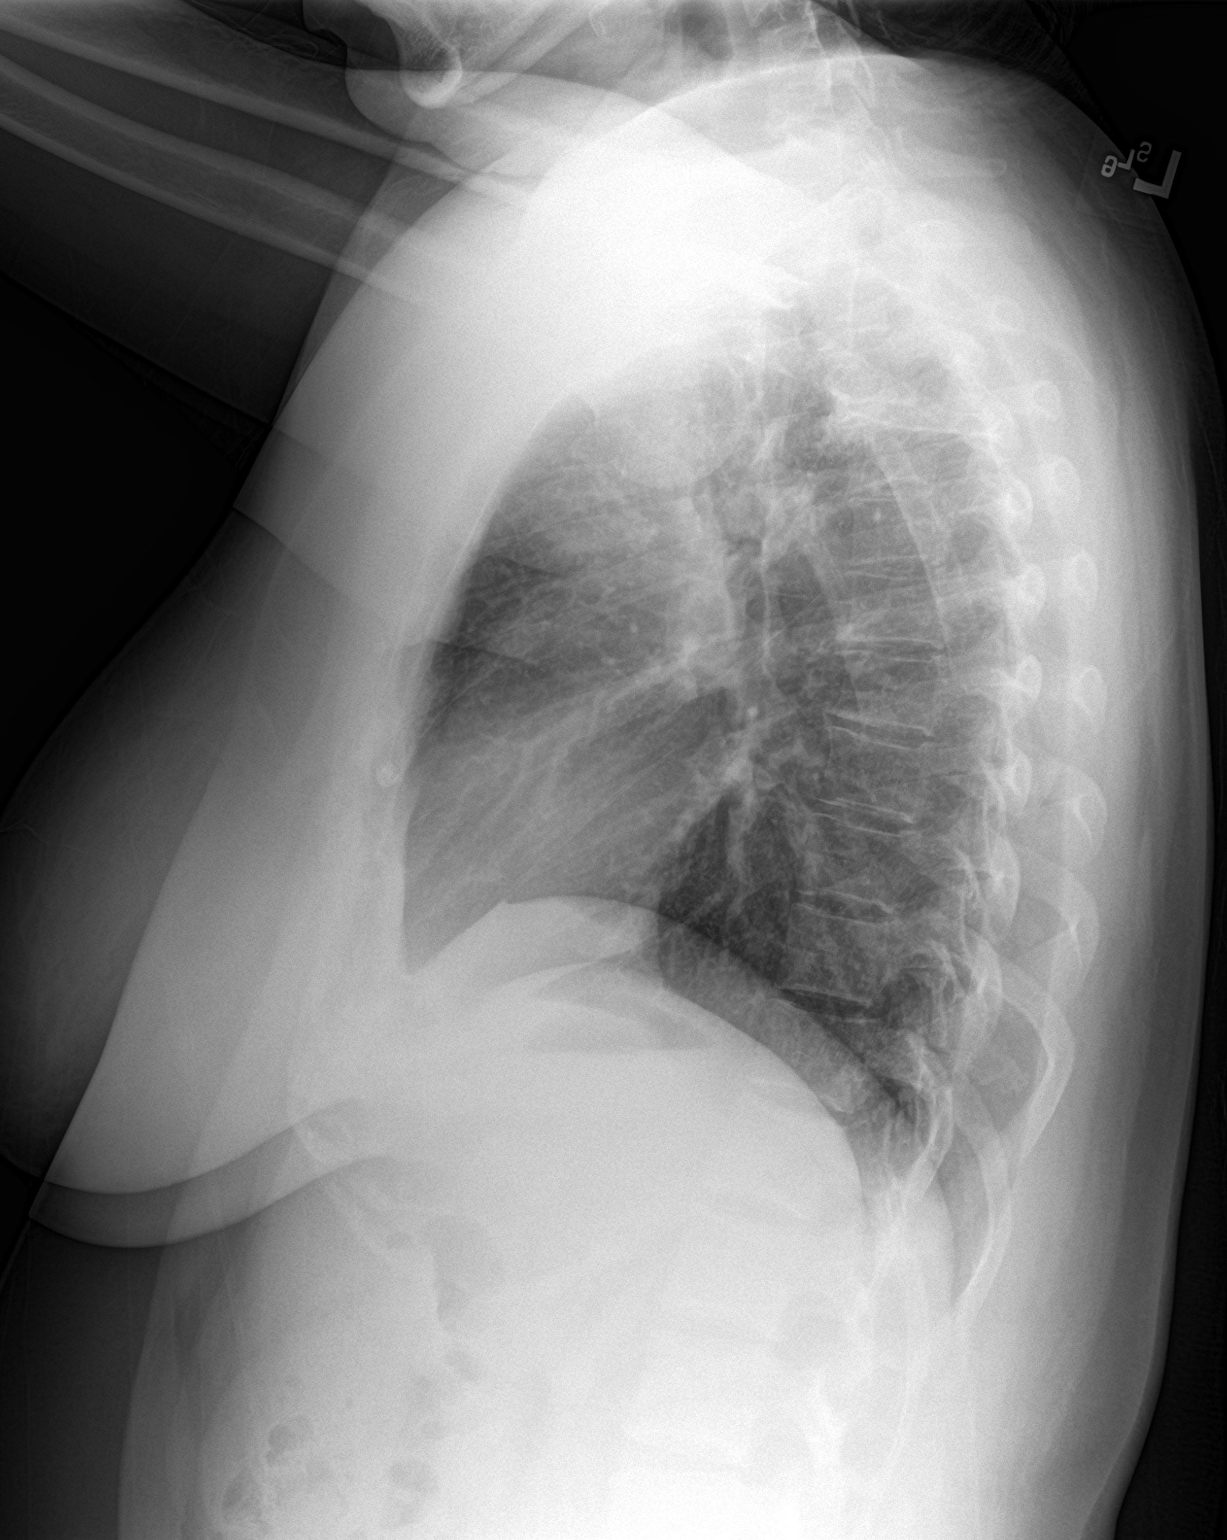

[2 of 2 positions shown; findings below may reference images not displayed]

FINDINGS: The heart size and mediastinal contours are within normal limits.
Both lungs are clear. The visualized skeletal structures are
unremarkable.
IMPRESSION: No active cardiopulmonary disease.

## 2023-09-11 NOTE — Progress Notes (Signed)
    GYNECOLOGY PROGRESS NOTE  Subjective:    Patient ID: Kelly Oneill, female    DOB: 1985-09-02, 38 y.o.   MRN: [id number]  HPI  Patient is a 38 y.o. [id number] female who presents for follow up on ultrasound. She was evaluated on 07/09/2023 and during that time she requested to follow up on a cyst from 6 months ago. Ultrasound was done on 08/06/2023.  She is continuing with right sided pain that comes approximately every 2 weeks.   The following portions of the patient's history were reviewed and updated as appropriate: allergies, current medications, past family history, past medical history, past social history, past surgical history, and problem list.  Review of Systems Pertinent items are noted in HPI.   Objective:   Blood pressure 120/78, pulse 69, height 4' 11 (1.499 m), weight 163 lb 14.4 oz (74.3 kg). Body mass index is 33.1 kg/m. General appearance: alert   Ultrasound:  ULTRASOUND REPORT   Location: [facility] Date of Service: 08/06/2023    Indications:Pelvic Pain Findings:  The uterus is anteverted and measures 9.3 x 4.8 x 6.1 cm with a uterine volume of 142.91 ml. Echo texture is heterogenous with evidence of a focal mass. Within the uterus is a small uterine fibroid noted   Intramural Fibroid 1: 1.6 x .9 x 1.3 cm   The Endometrium measures 3.7 mm. Hypoechoic areas were seen within the endometrium.   Right Ovary measures 3.1 x 2.0 x 1.8 cm. It is normal in appearance. Left Ovary measures 3.0 x 1.9 x 2.4 cm. It is normal in appearance. Survey of the adnexa demonstrates no adnexal masses. There is no free fluid in the cul de sac.   Impression: 1. Fibroid noted posterior uterine fundus: 1.6 x .9 x 1.3 cm 2. Small hypoechoic areas seen within endometrium. (Fluid ?)   Recommendations: 1.Clinical correlation with the patient's History and Physical Exam.  Assessment:   1. Pelvic pain      Plan:   -Reviewed normal US  findings.  Describes a right sided pain that gets inflamed where she can feel bloating externally. Based on the type of pain and US  data, it does not appear to be GYN related. Reviewed that the pelvis contains many different parts including GI and urinary. Will trial OCPs to try to suppress ovulation. If not effective, will refer to GI to rule out intestinal issues. Suggested she also try simethicone during painful periods to see if there is any relief.     Damien Parsley, CNM [facility]

## 2023-09-14 ENCOUNTER — Ambulatory Visit (INDEPENDENT_AMBULATORY_CARE_PROVIDER_SITE_OTHER): Admitting: Certified Nurse Midwife

## 2023-09-14 VITALS — BP 120/78 | HR 69 | Ht 59.0 in | Wt 163.9 lb

## 2023-09-14 DIAGNOSIS — R102 Pelvic and perineal pain: Secondary | ICD-10-CM

## 2023-09-14 DIAGNOSIS — N83201 Unspecified ovarian cyst, right side: Secondary | ICD-10-CM

## 2023-09-14 MED ORDER — NORETHIN ACE-ETH ESTRAD-FE 1-20 MG-MCG(24) PO TABS: 1.0000 | ORAL_TABLET | Freq: Every day | ORAL | 1 refills | Status: AC

## 2023-11-10 DIAGNOSIS — Z1331 Encounter for screening for depression: Secondary | ICD-10-CM | POA: Diagnosis not present

## 2023-11-10 DIAGNOSIS — R300 Dysuria: Secondary | ICD-10-CM | POA: Diagnosis not present

## 2023-11-10 DIAGNOSIS — Z1389 Encounter for screening for other disorder: Secondary | ICD-10-CM | POA: Diagnosis not present

## 2023-11-10 DIAGNOSIS — N760 Acute vaginitis: Secondary | ICD-10-CM | POA: Diagnosis not present

## 2023-12-22 DIAGNOSIS — Z1389 Encounter for screening for other disorder: Secondary | ICD-10-CM | POA: Diagnosis not present

## 2023-12-22 DIAGNOSIS — J069 Acute upper respiratory infection, unspecified: Secondary | ICD-10-CM | POA: Diagnosis not present

## 2024-01-25 DIAGNOSIS — Z0000 Encounter for general adult medical examination without abnormal findings: Secondary | ICD-10-CM | POA: Diagnosis not present

## 2024-01-25 DIAGNOSIS — Z23 Encounter for immunization: Secondary | ICD-10-CM | POA: Diagnosis not present

## 2024-01-25 DIAGNOSIS — J4520 Mild intermittent asthma, uncomplicated: Secondary | ICD-10-CM | POA: Diagnosis not present

## 2024-01-25 DIAGNOSIS — Z1389 Encounter for screening for other disorder: Secondary | ICD-10-CM | POA: Diagnosis not present

## 2024-01-25 DIAGNOSIS — M79676 Pain in unspecified toe(s): Secondary | ICD-10-CM | POA: Diagnosis not present
